# Patient Record
Sex: Female | Born: 2005 | Hispanic: Yes | Marital: Single | Smoking: Never smoker
Health system: Southern US, Community
[De-identification: ages and names within clinical notes are randomized; demographics above are authoritative.]

---
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
## 2023-06-13 ENCOUNTER — Ambulatory Visit: Payer: Self-pay

## 2023-11-18 ENCOUNTER — Other Ambulatory Visit (HOSPITAL_COMMUNITY): Payer: Self-pay

## 2023-11-18 MED ORDER — FLUZONE 0.5 ML IM SUSY
0.5000 mL | PREFILLED_SYRINGE | Freq: Once | INTRAMUSCULAR | 0 refills | Status: AC
  Filled 2023-11-18: qty 0.5, 1d supply, fill #0

## 2024-01-04 ENCOUNTER — Ambulatory Visit
Admission: RE | Admit: 2024-01-04 | Discharge: 2024-01-04 | Disposition: A | Discharge status: Home / Self Care | Payer: Self-pay | Source: Ambulatory Visit | Attending: Emergency Medicine | Admitting: Emergency Medicine

## 2024-01-04 VITALS — BP 116/74 | HR 76 | Temp 97.5°F | Resp 17

## 2024-01-04 DIAGNOSIS — B9689 Other specified bacterial agents as the cause of diseases classified elsewhere: Secondary | ICD-10-CM

## 2024-01-04 DIAGNOSIS — H109 Unspecified conjunctivitis: Secondary | ICD-10-CM

## 2024-01-04 MED ORDER — POLYMYXIN B-TRIMETHOPRIM 10000-0.1 UNIT/ML-% OP SOLN: 1.0000 [drp] | Freq: Four times a day (QID) | OPHTHALMIC | 0 refills | Status: DC

## 2024-01-04 MED ORDER — POLYMYXIN B-TRIMETHOPRIM 10000-0.1 UNIT/ML-% OP SOLN: 1.0000 [drp] | Freq: Four times a day (QID) | OPHTHALMIC | 0 refills | Status: AC

## 2024-01-04 NOTE — Discharge Instructions (Signed)
 Today you being treated for bacterial conjunctivitis.   Place one drop of polytrim  into the effected eye every 6 hours while awake for 7 days.Do not allow tip of dropper to touch eye.  May use cool compress for comfort and to remove discharge if present. Pat the eye, do not wipe.  If wearing contacts, dispose of current pair. Wear glasses until symptoms have resolved.   Do not rub eyes, this may cause more irritation.  May use benadryl, Claritin or Zyrtec  as needed to help if itching present.  Please avoid use of eye makeup until symptoms clear.  If symptoms persist after use of medication, please follow up at Urgent Care or with ophthalmologist (eye doctor)

## 2024-01-04 NOTE — ED Triage Notes (Signed)
 Patient bilateral itching and eye drainage x 4 days. Patient has used warm compress with mild relief.

## 2024-01-04 NOTE — ED Provider Notes (Signed)
 [facility]    CSN: [id number] Arrival date & time: 01/04/24  1554      History   Chief Complaint Chief Complaint  Patient presents with   Eye Problem    Entered by patient   Eye Drainage    HPI Tazia de La Jacolyn Barter is a 18 y.o. female.   Tichy sweloone darinage wqith crusting for 4 patient presents for evaluation of bilateral swelling of the eyelids, occurring intermittently, pruritus and drainage with crusting beginning 4 days ago.  Has attempted use of warm compresses which have been helpful.  Denies use of contacts.  No known sick contacts.  Denies visual changes or injury.   History reviewed. No pertinent past medical history.  There are no active problems to display for this patient.   History reviewed. No pertinent surgical history.  OB History   No obstetric history on file.      Home Medications    Prior to Admission medications  Medication Sig Start Date End Date Taking? Authorizing Provider  cetirizine  (ZYRTEC  ALLERGY) 10 MG tablet Take 1 tablet (10 mg total) by mouth daily. 12/20/22   Rising, [place], PA-C  gentamicin  cream (GARAMYCIN ) 0.1 % Apply 1 application topically 2 (two) times daily. 05/10/19   Janit Thresa HERO, DPM  terbinafine  (LAMISIL ) 250 MG tablet Take 1 tablet (250 mg total) by mouth daily. 05/31/19   Janit Thresa HERO, DPM    Family History History reviewed. No pertinent family history.  Social History Social History[1]   Allergies   Penicillins   Review of Systems Review of Systems   Physical Exam Triage Vital Signs ED Triage Vitals  Encounter Vitals Group     BP 01/04/24 1620 116/74     Girls Systolic BP Percentile --      Girls Diastolic BP Percentile --      Boys Systolic BP Percentile --      Boys Diastolic BP Percentile --      Pulse Rate 01/04/24 1620 76     Resp 01/04/24 1620 17     Temp 01/04/24 1620 (!) 97.5 F (36.4 C)     Temp Source 01/04/24 1620 Oral     SpO2 01/04/24 1620 100 %     Weight --       Height --      Head Circumference --      Peak Flow --      Pain Score 01/04/24 1622 0     Pain Loc --      Pain Education --      Exclude from Growth Chart --    No data found.  Updated Vital Signs BP 116/74 (BP Location: Left Arm)   Pulse 76   Temp (!) 97.5 F (36.4 C) (Oral)   Resp 17   LMP 12/23/2023 (Exact Date)   SpO2 100%   Visual Acuity Right Eye Distance: 20/20 Left Eye Distance: 20/20 Bilateral Distance: 20/16  Right Eye Near:   Left Eye Near:    Bilateral Near:     Physical Exam Constitutional:      Appearance: Normal appearance.  Eyes:     Comments: Mild erythema to the bilateral conjunctiva, no drainage noted, no stye noted on exam, vision grossly intact, extraocular movements intact  Pulmonary:     Effort: Pulmonary effort is normal.  Neurological:     Mental Status: She is alert and oriented to person, place, and time.      UC Treatments / Results  Labs (all labs ordered are listed, but only abnormal results are displayed) Labs Reviewed - No data to display  EKG   Radiology No results found.  Procedures Procedures (including critical care time)  Medications Ordered in UC Medications - No data to display  Initial Impression / Assessment and Plan / UC Course  I have reviewed the triage vital signs and the nursing notes.  Pertinent labs & imaging results that were available during my care of the patient were reviewed by me and considered in my medical decision making (see chart for details).  Bacterial conjunctivitis of both eyes  Presentation and symptomology concerning for infection, discussed with patient, prescribed Polytrim  and discussed administration recommend over-the-counter medications and nonpharmacological supportive care with follow-up as needed Final Clinical Impressions(s) / UC Diagnoses   Final diagnoses:  None   Discharge Instructions   None    ED Prescriptions   None    PDMP not reviewed this  encounter.     [1]  Social History Tobacco Use   Smoking status: Never   Smokeless tobacco: Never  Substance Use Topics   Alcohol use: Never   Drug use: Never     Teresa Shelba SAUNDERS, NP 01/04/24 1652
